# Patient Record
Sex: Female | Born: 1990 | Race: White | Hispanic: No | Marital: Single | Smoking: Current every day smoker
Health system: Southern US, Community
[De-identification: ages and names within clinical notes are randomized; demographics above are authoritative.]

## PROBLEM LIST (undated history)

## (undated) ENCOUNTER — Inpatient Hospital Stay (HOSPITAL_COMMUNITY): Payer: Self-pay

## (undated) DIAGNOSIS — Z789 Other specified health status: Secondary | ICD-10-CM

## (undated) HISTORY — PX: NO PAST SURGERIES: SHX2092

---
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
## 2010-12-07 ENCOUNTER — Emergency Department (HOSPITAL_BASED_OUTPATIENT_CLINIC_OR_DEPARTMENT_OTHER)
Admission: EM | Admit: 2010-12-07 | Discharge: 2010-12-07 | Payer: Self-pay | Source: Home / Self Care | Admitting: Emergency Medicine

## 2011-12-14 ENCOUNTER — Other Ambulatory Visit (HOSPITAL_COMMUNITY): Payer: Self-pay | Admitting: Obstetrics and Gynecology

## 2011-12-14 ENCOUNTER — Ambulatory Visit (HOSPITAL_COMMUNITY)
Admission: RE | Admit: 2011-12-14 | Discharge: 2011-12-14 | Disposition: A | Discharge status: Home / Self Care | Payer: Medicaid Other | Source: Ambulatory Visit | Attending: Obstetrics and Gynecology | Admitting: Obstetrics and Gynecology

## 2011-12-14 ENCOUNTER — Ambulatory Visit (HOSPITAL_COMMUNITY): Admission: RE | Admit: 2011-12-14 | Payer: Medicaid Other | Source: Ambulatory Visit

## 2011-12-14 DIAGNOSIS — Z1389 Encounter for screening for other disorder: Secondary | ICD-10-CM | POA: Insufficient documentation

## 2011-12-14 DIAGNOSIS — O2690 Pregnancy related conditions, unspecified, unspecified trimester: Secondary | ICD-10-CM

## 2011-12-14 DIAGNOSIS — O99330 Smoking (tobacco) complicating pregnancy, unspecified trimester: Secondary | ICD-10-CM | POA: Insufficient documentation

## 2011-12-14 DIAGNOSIS — Z363 Encounter for antenatal screening for malformations: Secondary | ICD-10-CM | POA: Insufficient documentation

## 2011-12-14 DIAGNOSIS — O358XX0 Maternal care for other (suspected) fetal abnormality and damage, not applicable or unspecified: Secondary | ICD-10-CM | POA: Insufficient documentation

## 2012-01-06 ENCOUNTER — Ambulatory Visit (HOSPITAL_COMMUNITY)
Admission: RE | Admit: 2012-01-06 | Discharge: 2012-01-06 | Disposition: A | Discharge status: Home / Self Care | Payer: Medicaid Other | Source: Ambulatory Visit | Attending: Obstetrics and Gynecology | Admitting: Obstetrics and Gynecology

## 2012-01-06 ENCOUNTER — Other Ambulatory Visit (HOSPITAL_COMMUNITY): Payer: Self-pay | Admitting: Obstetrics and Gynecology

## 2012-01-06 ENCOUNTER — Encounter (HOSPITAL_COMMUNITY): Payer: Self-pay

## 2012-01-06 VITALS — BP 120/70 | HR 93 | Wt 115.0 lb

## 2012-01-06 DIAGNOSIS — O99340 Other mental disorders complicating pregnancy, unspecified trimester: Secondary | ICD-10-CM

## 2012-01-06 DIAGNOSIS — O358XX0 Maternal care for other (suspected) fetal abnormality and damage, not applicable or unspecified: Secondary | ICD-10-CM

## 2012-01-06 DIAGNOSIS — O99330 Smoking (tobacco) complicating pregnancy, unspecified trimester: Secondary | ICD-10-CM | POA: Insufficient documentation

## 2012-01-06 NOTE — Progress Notes (Signed)
Amanda Whitehead was seen for ultrasound appointment today.  Please see [facility] report for details.

## 2012-01-24 ENCOUNTER — Ambulatory Visit (HOSPITAL_COMMUNITY)
Admission: RE | Admit: 2012-01-24 | Discharge: 2012-01-24 | Disposition: A | Discharge status: Home / Self Care | Payer: Medicaid Other | Source: Ambulatory Visit | Attending: Obstetrics and Gynecology | Admitting: Obstetrics and Gynecology

## 2012-01-24 DIAGNOSIS — O358XX0 Maternal care for other (suspected) fetal abnormality and damage, not applicable or unspecified: Secondary | ICD-10-CM

## 2012-01-24 DIAGNOSIS — O99330 Smoking (tobacco) complicating pregnancy, unspecified trimester: Secondary | ICD-10-CM | POA: Insufficient documentation

## 2012-02-15 ENCOUNTER — Inpatient Hospital Stay (HOSPITAL_COMMUNITY)
Admission: AD | Admit: 2012-02-15 | Discharge: 2012-02-15 | Disposition: A | Discharge status: Home / Self Care | Payer: Medicaid Other | Source: Ambulatory Visit | Attending: Obstetrics and Gynecology | Admitting: Obstetrics and Gynecology

## 2012-02-15 ENCOUNTER — Ambulatory Visit (HOSPITAL_COMMUNITY): Payer: Medicaid Other

## 2012-02-15 DIAGNOSIS — Z298 Encounter for other specified prophylactic measures: Secondary | ICD-10-CM | POA: Insufficient documentation

## 2012-02-15 DIAGNOSIS — Z2989 Encounter for other specified prophylactic measures: Secondary | ICD-10-CM | POA: Insufficient documentation

## 2012-02-15 DIAGNOSIS — Z3480 Encounter for supervision of other normal pregnancy, unspecified trimester: Secondary | ICD-10-CM | POA: Insufficient documentation

## 2012-02-15 MED ORDER — RHO D IMMUNE GLOBULIN 1500 UNIT/2ML IJ SOLN
300.0000 ug | Freq: Once | INTRAMUSCULAR | Status: AC
  Administered 2012-02-15: 300 ug via INTRAMUSCULAR

## 2012-02-15 NOTE — Plan of Care (Signed)
Rhophylac information sheet given to the patient in the lobby. Explained the 1 1/2 hours required to process this order and give the injection. Patient verbalizes understanding.

## 2012-02-16 ENCOUNTER — Ambulatory Visit (HOSPITAL_COMMUNITY): Payer: Medicaid Other

## 2012-02-16 LAB — RH IG WORKUP (INCLUDES ABO/RH): Antibody Screen: NEGATIVE

## 2012-02-24 ENCOUNTER — Other Ambulatory Visit: Payer: Medicaid Other

## 2012-02-24 ENCOUNTER — Encounter (INDEPENDENT_AMBULATORY_CARE_PROVIDER_SITE_OTHER): Payer: Medicaid Other | Admitting: Obstetrics and Gynecology

## 2012-02-24 DIAGNOSIS — Z331 Pregnant state, incidental: Secondary | ICD-10-CM

## 2012-03-03 ENCOUNTER — Inpatient Hospital Stay (HOSPITAL_COMMUNITY): Payer: Medicaid Other

## 2012-03-03 ENCOUNTER — Inpatient Hospital Stay (HOSPITAL_COMMUNITY)
Admission: AD | Admit: 2012-03-03 | Discharge: 2012-03-06 | DRG: 765 | Disposition: A | Discharge status: Home / Self Care | Payer: Medicaid Other | Attending: Obstetrics and Gynecology | Admitting: Obstetrics and Gynecology

## 2012-03-03 ENCOUNTER — Encounter (HOSPITAL_COMMUNITY): Payer: Self-pay | Admitting: Anesthesiology

## 2012-03-03 ENCOUNTER — Encounter (HOSPITAL_COMMUNITY): Payer: Self-pay | Admitting: *Deleted

## 2012-03-03 ENCOUNTER — Encounter (HOSPITAL_COMMUNITY)
Admission: AD | Disposition: A | Discharge status: Home / Self Care | Payer: Self-pay | Source: Home / Self Care | Attending: Obstetrics and Gynecology

## 2012-03-03 ENCOUNTER — Inpatient Hospital Stay (HOSPITAL_COMMUNITY): Payer: Medicaid Other | Admitting: Anesthesiology

## 2012-03-03 DIAGNOSIS — Z98891 History of uterine scar from previous surgery: Secondary | ICD-10-CM

## 2012-03-03 DIAGNOSIS — O358XX0 Maternal care for other (suspected) fetal abnormality and damage, not applicable or unspecified: Principal | ICD-10-CM | POA: Diagnosis present

## 2012-03-03 DIAGNOSIS — O0930 Supervision of pregnancy with insufficient antenatal care, unspecified trimester: Secondary | ICD-10-CM

## 2012-03-03 DIAGNOSIS — IMO0002 Reserved for concepts with insufficient information to code with codable children: Secondary | ICD-10-CM

## 2012-03-03 DIAGNOSIS — O9903 Anemia complicating the puerperium: Secondary | ICD-10-CM | POA: Diagnosis not present

## 2012-03-03 DIAGNOSIS — D649 Anemia, unspecified: Secondary | ICD-10-CM | POA: Diagnosis not present

## 2012-03-03 HISTORY — DX: Other specified health status: Z78.9

## 2012-03-03 LAB — DIFFERENTIAL
Basophils Absolute: 0 10*3/uL (ref 0.0–0.1)
Eosinophils Absolute: 0 10*3/uL (ref 0.0–0.7)
Eosinophils Relative: 0 % (ref 0–5)

## 2012-03-03 LAB — CBC
MCH: 31.8 pg (ref 26.0–34.0)
MCV: 94.7 fL (ref 78.0–100.0)
Platelets: 208 10*3/uL (ref 150–400)
RDW: 12.7 % (ref 11.5–15.5)
WBC: 19.5 10*3/uL — ABNORMAL HIGH (ref 4.0–10.5)

## 2012-03-03 LAB — URINE MICROSCOPIC-ADD ON

## 2012-03-03 LAB — URINALYSIS, ROUTINE W REFLEX MICROSCOPIC
Bilirubin Urine: NEGATIVE
Specific Gravity, Urine: 1.02 (ref 1.005–1.030)
Urobilinogen, UA: 0.2 mg/dL (ref 0.0–1.0)

## 2012-03-03 LAB — WET PREP, GENITAL
Clue Cells Wet Prep HPF POC: NONE SEEN
Trich, Wet Prep: NONE SEEN

## 2012-03-03 LAB — RPR: RPR Ser Ql: NONREACTIVE

## 2012-03-03 SURGERY — Surgical Case
Anesthesia: Spinal

## 2012-03-03 MED ORDER — SIMETHICONE 80 MG PO CHEW
80.0000 mg | CHEWABLE_TABLET | Freq: Three times a day (TID) | ORAL | Status: DC
  Administered 2012-03-03 – 2012-03-06 (×11): 80 mg via ORAL

## 2012-03-03 MED ORDER — PENICILLIN G POTASSIUM 5000000 UNITS IJ SOLR: 2.5000 10*6.[IU] | INTRAVENOUS | Status: DC

## 2012-03-03 MED ORDER — SCOPOLAMINE 1 MG/3DAYS TD PT72
MEDICATED_PATCH | TRANSDERMAL | Status: AC
  Administered 2012-03-03: 1.5 mg via TRANSDERMAL
  Filled 2012-03-03: qty 1

## 2012-03-03 MED ORDER — NALBUPHINE HCL 10 MG/ML IJ SOLN
5.0000 mg | INTRAMUSCULAR | Status: DC | PRN
  Filled 2012-03-03: qty 1

## 2012-03-03 MED ORDER — IBUPROFEN 600 MG PO TABS
600.0000 mg | ORAL_TABLET | Freq: Four times a day (QID) | ORAL | Status: DC | PRN
  Filled 2012-03-03 (×10): qty 1

## 2012-03-03 MED ORDER — MAGNESIUM SULFATE 40 MG/ML IJ SOLN: 2.0000 g | Freq: Once | INTRAMUSCULAR | Status: DC

## 2012-03-03 MED ORDER — DIBUCAINE 1 % RE OINT
1.0000 "application " | TOPICAL_OINTMENT | RECTAL | Status: DC | PRN
  Filled 2012-03-03: qty 28

## 2012-03-03 MED ORDER — METHYLERGONOVINE MALEATE 0.2 MG PO TABS
0.2000 mg | ORAL_TABLET | ORAL | Status: DC | PRN
Start: 2012-03-03 — End: 2012-03-06

## 2012-03-03 MED ORDER — NALOXONE HCL 0.4 MG/ML IJ SOLN
1.0000 ug/kg/h | INTRAMUSCULAR | Status: DC | PRN
  Filled 2012-03-03: qty 2.5

## 2012-03-03 MED ORDER — PENICILLIN G POTASSIUM 5000000 UNITS IJ SOLR
5.0000 10*6.[IU] | Freq: Once | INTRAVENOUS | Status: DC
  Filled 2012-03-03: qty 5

## 2012-03-03 MED ORDER — METHYLERGONOVINE MALEATE 0.2 MG/ML IJ SOLN: 0.2000 mg | INTRAMUSCULAR | Status: DC | PRN

## 2012-03-03 MED ORDER — TETANUS-DIPHTH-ACELL PERTUSSIS 5-2.5-18.5 LF-MCG/0.5 IM SUSP
0.5000 mL | Freq: Once | INTRAMUSCULAR | Status: AC
  Administered 2012-03-04: 0.5 mL via INTRAMUSCULAR
  Filled 2012-03-03: qty 0.5

## 2012-03-03 MED ORDER — FENTANYL CITRATE 0.05 MG/ML IJ SOLN: 25.0000 ug | INTRAMUSCULAR | Status: DC | PRN

## 2012-03-03 MED ORDER — IBUPROFEN 600 MG PO TABS
600.0000 mg | ORAL_TABLET | Freq: Four times a day (QID) | ORAL | Status: DC
  Administered 2012-03-03 – 2012-03-06 (×11): 600 mg via ORAL
  Filled 2012-03-03: qty 1

## 2012-03-03 MED ORDER — SIMETHICONE 80 MG PO CHEW: 80.0000 mg | CHEWABLE_TABLET | ORAL | Status: DC | PRN

## 2012-03-03 MED ORDER — SENNOSIDES-DOCUSATE SODIUM 8.6-50 MG PO TABS
2.0000 | ORAL_TABLET | Freq: Every day | ORAL | Status: DC
  Administered 2012-03-03 – 2012-03-05 (×3): 2 via ORAL

## 2012-03-03 MED ORDER — MEPERIDINE HCL 25 MG/ML IJ SOLN
6.2500 mg | INTRAMUSCULAR | Status: DC | PRN
  Administered 2012-03-03: 6.25 mg via INTRAVENOUS

## 2012-03-03 MED ORDER — ZOLPIDEM TARTRATE 10 MG PO TABS: 10.0000 mg | ORAL_TABLET | Freq: Every evening | ORAL | Status: DC | PRN

## 2012-03-03 MED ORDER — KETOROLAC TROMETHAMINE 30 MG/ML IJ SOLN: 30.0000 mg | Freq: Four times a day (QID) | INTRAMUSCULAR | Status: AC | PRN

## 2012-03-03 MED ORDER — CEFAZOLIN SODIUM 1-5 GM-% IV SOLN
1.0000 g | Freq: Once | INTRAVENOUS | Status: AC
  Administered 2012-03-03: 1 g via INTRAVENOUS

## 2012-03-03 MED ORDER — ONDANSETRON HCL 4 MG/2ML IJ SOLN: 4.0000 mg | Freq: Three times a day (TID) | INTRAMUSCULAR | Status: DC | PRN

## 2012-03-03 MED ORDER — MAGNESIUM SULFATE 40 MG/ML IJ SOLN
4.0000 g | Freq: Once | INTRAMUSCULAR | Status: DC
Start: 2012-03-03 — End: 2012-03-03
  Filled 2012-03-03: qty 100

## 2012-03-03 MED ORDER — ONDANSETRON HCL 4 MG PO TABS: 4.0000 mg | ORAL_TABLET | ORAL | Status: DC | PRN

## 2012-03-03 MED ORDER — OXYTOCIN 20 UNITS IN LACTATED RINGERS INFUSION - SIMPLE: 125.0000 mL/h | INTRAVENOUS | Status: AC

## 2012-03-03 MED ORDER — NALOXONE HCL 0.4 MG/ML IJ SOLN: 0.4000 mg | INTRAMUSCULAR | Status: DC | PRN

## 2012-03-03 MED ORDER — MEPERIDINE HCL 25 MG/ML IJ SOLN
INTRAMUSCULAR | Status: DC | PRN
  Administered 2012-03-03: 25 mg via INTRAVENOUS

## 2012-03-03 MED ORDER — BUPIVACAINE HCL (PF) 0.25 % IJ SOLN
INTRAMUSCULAR | Status: DC | PRN
  Administered 2012-03-03: 10 mL

## 2012-03-03 MED ORDER — MORPHINE SULFATE (PF) 0.5 MG/ML IJ SOLN
INTRAMUSCULAR | Status: DC | PRN
  Administered 2012-03-03: .15 mg via INTRATHECAL

## 2012-03-03 MED ORDER — ACETAMINOPHEN 325 MG PO TABS: 650.0000 mg | ORAL_TABLET | ORAL | Status: DC | PRN

## 2012-03-03 MED ORDER — WITCH HAZEL-GLYCERIN EX PADS: 1.0000 "application " | MEDICATED_PAD | CUTANEOUS | Status: DC | PRN

## 2012-03-03 MED ORDER — METOCLOPRAMIDE HCL 5 MG/ML IJ SOLN: 10.0000 mg | Freq: Three times a day (TID) | INTRAMUSCULAR | Status: DC | PRN

## 2012-03-03 MED ORDER — CALCIUM CARBONATE ANTACID 500 MG PO CHEW
2.0000 | CHEWABLE_TABLET | ORAL | Status: DC | PRN
  Filled 2012-03-03: qty 2

## 2012-03-03 MED ORDER — OXYCODONE-ACETAMINOPHEN 5-325 MG PO TABS
1.0000 | ORAL_TABLET | ORAL | Status: DC | PRN
  Administered 2012-03-03 – 2012-03-05 (×8): 1 via ORAL
  Filled 2012-03-03 (×8): qty 1

## 2012-03-03 MED ORDER — ZOLPIDEM TARTRATE 5 MG PO TABS: 5.0000 mg | ORAL_TABLET | Freq: Every evening | ORAL | Status: DC | PRN

## 2012-03-03 MED ORDER — DIPHENHYDRAMINE HCL 50 MG/ML IJ SOLN: 25.0000 mg | INTRAMUSCULAR | Status: DC | PRN

## 2012-03-03 MED ORDER — LACTATED RINGERS IV SOLN
INTRAVENOUS | Status: DC
  Administered 2012-03-03 (×2): via INTRAVENOUS

## 2012-03-03 MED ORDER — PRENATAL MULTIVITAMIN CH: 1.0000 | ORAL_TABLET | Freq: Every day | ORAL | Status: DC

## 2012-03-03 MED ORDER — MAGNESIUM SULFATE 40 G IN LACTATED RINGERS - SIMPLE
2.0000 g/h | INTRAVENOUS | Status: DC
  Administered 2012-03-03: 2 g via INTRAVENOUS
  Administered 2012-03-03: 2 g/h via INTRAVENOUS
  Filled 2012-03-03: qty 500

## 2012-03-03 MED ORDER — KETOROLAC TROMETHAMINE 30 MG/ML IJ SOLN
INTRAMUSCULAR | Status: DC | PRN
  Administered 2012-03-03: 30 mg via INTRAVENOUS

## 2012-03-03 MED ORDER — MENTHOL 3 MG MT LOZG: 1.0000 | LOZENGE | OROMUCOSAL | Status: DC | PRN

## 2012-03-03 MED ORDER — LANOLIN HYDROUS EX OINT: 1.0000 "application " | TOPICAL_OINTMENT | CUTANEOUS | Status: DC | PRN

## 2012-03-03 MED ORDER — ONDANSETRON HCL 4 MG/2ML IJ SOLN: 4.0000 mg | INTRAMUSCULAR | Status: DC | PRN

## 2012-03-03 MED ORDER — OXYTOCIN 20 UNITS IN LACTATED RINGERS INFUSION - SIMPLE
INTRAVENOUS | Status: DC | PRN
  Administered 2012-03-03 (×2): 20 [IU] via INTRAVENOUS

## 2012-03-03 MED ORDER — FENTANYL CITRATE 0.05 MG/ML IJ SOLN
INTRAMUSCULAR | Status: DC | PRN
  Administered 2012-03-03: 25 ug via INTRATHECAL
  Administered 2012-03-03: 25 ug via INTRAVENOUS
  Administered 2012-03-03: 50 ug via INTRAVENOUS

## 2012-03-03 MED ORDER — PRENATAL MULTIVITAMIN CH
1.0000 | ORAL_TABLET | Freq: Every day | ORAL | Status: DC
  Administered 2012-03-03 – 2012-03-06 (×4): 1 via ORAL
  Filled 2012-03-03 (×4): qty 1

## 2012-03-03 MED ORDER — DIPHENHYDRAMINE HCL 25 MG PO CAPS: 25.0000 mg | ORAL_CAPSULE | ORAL | Status: DC | PRN

## 2012-03-03 MED ORDER — ONDANSETRON HCL 4 MG/2ML IJ SOLN
INTRAMUSCULAR | Status: DC | PRN
  Administered 2012-03-03: 4 mg via INTRAVENOUS

## 2012-03-03 MED ORDER — DOCUSATE SODIUM 100 MG PO CAPS
100.0000 mg | ORAL_CAPSULE | Freq: Every day | ORAL | Status: DC
  Filled 2012-03-03: qty 1

## 2012-03-03 MED ORDER — PRENATAL MULTIVITAMIN CH
1.0000 | ORAL_TABLET | Freq: Every day | ORAL | Status: DC
Start: 2012-03-03 — End: 2012-03-03
  Filled 2012-03-03: qty 1

## 2012-03-03 MED ORDER — DIPHENHYDRAMINE HCL 25 MG PO CAPS: 25.0000 mg | ORAL_CAPSULE | Freq: Four times a day (QID) | ORAL | Status: DC | PRN

## 2012-03-03 MED ORDER — BUTORPHANOL TARTRATE 2 MG/ML IJ SOLN
1.0000 mg | INTRAMUSCULAR | Status: DC
  Administered 2012-03-03: 1 mg via INTRAVENOUS
  Filled 2012-03-03: qty 1

## 2012-03-03 MED ORDER — FERROUS SULFATE 325 (65 FE) MG PO TABS
325.0000 mg | ORAL_TABLET | Freq: Two times a day (BID) | ORAL | Status: DC
  Administered 2012-03-03 – 2012-03-06 (×6): 325 mg via ORAL
  Filled 2012-03-03 (×6): qty 1

## 2012-03-03 MED ORDER — DIPHENHYDRAMINE HCL 50 MG/ML IJ SOLN: 12.5000 mg | INTRAMUSCULAR | Status: DC | PRN

## 2012-03-03 MED ORDER — SODIUM CHLORIDE 0.9 % IJ SOLN: 3.0000 mL | INTRAMUSCULAR | Status: DC | PRN

## 2012-03-03 MED ORDER — PHENYLEPHRINE HCL 10 MG/ML IJ SOLN
INTRAMUSCULAR | Status: DC | PRN
  Administered 2012-03-03 (×2): 80 ug via INTRAVENOUS

## 2012-03-03 MED ORDER — KETOROLAC TROMETHAMINE 60 MG/2ML IM SOLN
60.0000 mg | Freq: Once | INTRAMUSCULAR | Status: AC | PRN
  Filled 2012-03-03: qty 2

## 2012-03-03 MED ORDER — SCOPOLAMINE 1 MG/3DAYS TD PT72
1.0000 | MEDICATED_PATCH | Freq: Once | TRANSDERMAL | Status: DC
  Administered 2012-03-03: 1.5 mg via TRANSDERMAL
  Filled 2012-03-03: qty 1

## 2012-03-03 MED ORDER — MEPERIDINE HCL 25 MG/ML IJ SOLN
INTRAMUSCULAR | Status: AC
  Administered 2012-03-03: 6.25 mg via INTRAVENOUS
  Filled 2012-03-03: qty 1

## 2012-03-03 MED ORDER — MEASLES, MUMPS & RUBELLA VAC ~~LOC~~ INJ
0.5000 mL | INJECTION | Freq: Once | SUBCUTANEOUS | Status: DC
  Filled 2012-03-03: qty 0.5

## 2012-03-03 SURGICAL SUPPLY — 36 items
APL SKNCLS STERI-STRIP NONHPOA (GAUZE/BANDAGES/DRESSINGS) ×1
BENZOIN TINCTURE PRP APPL 2/3 (GAUZE/BANDAGES/DRESSINGS) ×2 IMPLANT
BOOTIES KNEE HIGH SLOAN (MISCELLANEOUS) ×4 IMPLANT
CHLORAPREP W/TINT 26ML (MISCELLANEOUS) ×2 IMPLANT
CLOTH BEACON ORANGE TIMEOUT ST (SAFETY) ×2 IMPLANT
DRAIN JACKSON PRT FLT 10 (DRAIN) IMPLANT
DRESSING TELFA 8X3 (GAUZE/BANDAGES/DRESSINGS) ×2 IMPLANT
ELECT REM PT RETURN 9FT ADLT (ELECTROSURGICAL) ×2
ELECTRODE REM PT RTRN 9FT ADLT (ELECTROSURGICAL) ×1 IMPLANT
EVACUATOR SILICONE 100CC (DRAIN) IMPLANT
EXTRACTOR VACUUM M CUP 4 TUBE (SUCTIONS) IMPLANT
GAUZE SPONGE 4X4 12PLY STRL LF (GAUZE/BANDAGES/DRESSINGS) ×3 IMPLANT
GLOVE BIOGEL PI IND STRL 7.0 (GLOVE) ×2 IMPLANT
GLOVE BIOGEL PI INDICATOR 7.0 (GLOVE) ×2
GLOVE ECLIPSE 6.5 STRL STRAW (GLOVE) ×2 IMPLANT
GOWN PREVENTION PLUS LG XLONG (DISPOSABLE) ×6 IMPLANT
KIT ABG SYR 3ML LUER SLIP (SYRINGE) IMPLANT
NDL HYPO 25X5/8 SAFETYGLIDE (NEEDLE) IMPLANT
NEEDLE HYPO 22GX1.5 SAFETY (NEEDLE) ×2 IMPLANT
NEEDLE HYPO 25X5/8 SAFETYGLIDE (NEEDLE) IMPLANT
NS IRRIG 1000ML POUR BTL (IV SOLUTION) ×4 IMPLANT
PACK C SECTION WH (CUSTOM PROCEDURE TRAY) ×2 IMPLANT
PAD ABD 7.5X8 STRL (GAUZE/BANDAGES/DRESSINGS) ×2 IMPLANT
RTRCTR C-SECT PINK 25CM LRG (MISCELLANEOUS) ×1 IMPLANT
SLEEVE SCD COMPRESS KNEE MED (MISCELLANEOUS) IMPLANT
STRIP CLOSURE SKIN 1/2X4 (GAUZE/BANDAGES/DRESSINGS) ×2 IMPLANT
SUT CHROMIC GUT AB #0 18 (SUTURE) IMPLANT
SUT MNCRL AB 3-0 PS2 27 (SUTURE) ×2 IMPLANT
SUT SILK 2 0 FSL 18 (SUTURE) IMPLANT
SUT VIC AB 0 CTX 36 (SUTURE) ×4
SUT VIC AB 0 CTX36XBRD ANBCTRL (SUTURE) ×2 IMPLANT
SUT VIC AB 1 CT1 36 (SUTURE) ×4 IMPLANT
SYR 20CC LL (SYRINGE) ×2 IMPLANT
TOWEL OR 17X24 6PK STRL BLUE (TOWEL DISPOSABLE) ×4 IMPLANT
TRAY FOLEY CATH 14FR (SET/KITS/TRAYS/PACK) ×2 IMPLANT
WATER STERILE IRR 1000ML POUR (IV SOLUTION) ×2 IMPLANT

## 2012-03-03 NOTE — Anesthesia Preprocedure Evaluation (Signed)

## 2012-03-03 NOTE — Anesthesia Procedure Notes (Signed)

## 2012-03-03 NOTE — Progress Notes (Signed)
UR Chart review completed.  

## 2012-03-03 NOTE — Op Note (Signed)
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 Cristela Blue  Procedure: Primary low transverse cesarean section  Surgeon: Dr. Dois Davenport Cordai Rodrigue  Assistant: Lavera Guise CNM  Estimated blood loss: 600 cc  Procedure:  After being informed of the planned procedure and possible complications including bleeding, infection, injury to other organs, informed consent is obtained. The patient is taken to OR #9 and given spinal anesthesia without complication. She is placed in the dorsal decubitus position with the pelvis tilted to the left. She is then prepped and draped in a sterile fashion. A Foley catheter is inserted in her bladder.  After assessing adequate level of anesthesia, we infiltrate the suprapubic area with 20 cc of Marcaine 0.25 and perform a Pfannenstiel incision which is brought down sharply to the fascia. The fascia is entered in a low transverse fashion. Linea alba is dissected. Peritoneum is entered in a midline fashion. An Alexis retractor is easily positioned. Visceral peritoneum is entered in a low transverse fashion allowing [place] to safely retract bladder by developing a bladder flap.  The myometrium is then entered in a low transverse fashion; first with knife and then extended bluntly. Amniotic fluid is clear and very abundant. We assist the birth of a female  infant in vertex presentation with arms flexed and legs flexed. We also not bilateral clubfeet.. Mouth and nose are suctioned. The baby is delivered. The cord is clamped and sectioned. The baby is given to the neonatologist present in the room.  10 cc of blood is drawn from the umbilical vein.2 cc of blood is drawn from the umbilical artery for blood gas.The placenta is allowed to deliver spontaneously. It is complete and the cord has 3 vessels. Uterine revision is negative.  We proceed with closure of the  myometrium in 2 layers: First with a running locked suture of 0 Vicryl, then with a Lembert suture of 0 Vicryl imbricating the first one. Hemostasis is completed with cauterization on peritoneal edges.  Both paracolic gutters are cleaned. Both tubes and ovaries are assessed and normal. The pelvis is profusely irrigated with warm saline to confirm a satisfactory hemostasis.  Retractors and sponges are removed. Under fascia hemostasis is completed with cauterization. The fascia is then closed with 2 running sutures of 0 Vicryl meeting midline. The wound is irrigated with warm saline and hemostasis is completed with cauterization. The skin is closed with a subcuticular suture of 3-0 Monocryl and Steri-Strips.  Instrument and sponge count is complete x2. Estimated blood loss is 600 cc.  The procedure is well tolerated by the patient who is taken to recovery room in a well and stable condition.  female baby was born at 10:56. Apgars are pending.    Specimen: Placenta sent to [facility]   Jaelyne Deeg A MD 3/8/201312:08 PM

## 2012-03-03 NOTE — Brief Op Note (Signed)
03/03/2012  10:59 AM  PATIENT:  Amanda Whitehead  21 y.o. 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       [facility] AMD 03/03/2012 10:59 AM

## 2012-03-03 NOTE — H&P (Signed)
Amanda Whitehead is a 21 y.o. female presenting for c/o of constipation had a bm since getting here, backache, denies srom, vag bleeding, with +FM, will need C/S states. Pg significant for fetal arthogryposis History OB History    Grav Para Term Preterm Abortions TAB SAB Ect Mult Living   1 0 0 0 0 0 0 0 0 0     med hx neg Meds: PNV  Past Surgical History  Procedure Date  . No past surgeries    Family History: father- HTN, DM, Mother-Thyroid disorder Social History:  reports that she has never smoked. She does not have any smokeless tobacco history on file. She reports that she does not drink alcohol or use illicit drugs.  ROS  Dilation: 3 Effacement (%): 90 Station: -2 Exam by:: Estrella Myrtle  CNM Blood pressure 98/62, pulse 117, temperature 97.6 F (36.4 C), temperature source Oral, resp. rate 22, height 5\' 2"  (1.575 m), weight 57.607 kg (127 lb), last menstrual period 07/06/2011. Exam Physical Exam tense with contractionis, lungs clear bilaterally, ap RRR, abd soft, gravid, nt, bowel sounds active, no edema lower legs, SSE , vag 3 100 -2 bulging membranes. Fhts category 1 uc q 2-3 mod Prenatal labs: ABO, Rh: --/--/O NEG (02/19 1620) Antibody: NEG (02/19 1620) Rubella:  Immnune RPR:   NR HBsAg:   neg HIV:   neg GBS:   pending  Assessment/Plan: 31 3/7 week IUP PTL Pending FFN not sent, sent GBS, wet prep, ua, unable to do GC/CHL pt to uncomfortable, discussed per telephone with Dr. Estanislado Pandy, plan Magnesium, Pen G, Betamethasone, patient agrees with plan of care.  Addendum; Dr. Estanislado Pandy here now vag 5 for C/S consented by MD.  [facility], [facility] 03/03/2012, 9:24 AM

## 2012-03-03 NOTE — Anesthesia Postprocedure Evaluation (Signed)
Anesthesia Post Note  Patient: Amanda Whitehead  Procedure(s) Performed: Procedure(s) (LRB): CESAREAN SECTION (N/A)  Anesthesia type: Spinal  Patient location: PACU  Post pain: Pain level controlled  Post assessment: Post-op Vital signs reviewed  Last Vitals:  Filed Vitals:   03/03/12 1100  BP: 114/70  Pulse: 94  Temp:   Resp: 16    Post vital signs: Reviewed  Level of consciousness: awake  Complications: No apparent anesthesia complications

## 2012-03-03 NOTE — Progress Notes (Signed)
I was referred to pt by Child psychotherapist.  I spoke with Amanda Whitehead and the baby's father, Amanda Whitehead.   They were joyful at baby's birth and did not seem to want to talk about any concerns that they had.  They told me that the doctors were putting in a central line, but that they had her stable.  They asked for prayer and we prayed for Amanda Whitehead.  I spoke with the neonatologist about my concerns that they weren't really aware of the critical condition of the baby that the social worker had informed me of.  He said that they were not able to hear it or take it in right now.  He said he will continue to talk with them about the baby's situation and will continue to make sure that both MOB and FOB can see Amanda Whitehead as much as possible.  Please page as needed, [phone].  Chaplain Katy Analee Montee 3:08 PM

## 2012-03-03 NOTE — Progress Notes (Signed)
Patient states she has been having cramping and constipation  since Wednesday. Has not had a bowel movement since last weekend.

## 2012-03-03 NOTE — Preoperative (Signed)
Beta Blockers   Reason not to administer Beta Blockers:Not Applicable 

## 2012-03-03 NOTE — Transfer of Care (Signed)
Immediate Anesthesia Transfer of Care Note  Patient: Amanda Whitehead  Procedure(s) Performed: Procedure(s) (LRB): CESAREAN SECTION (N/A)  Patient Location: PACU  Anesthesia Type: Spinal  Level of Consciousness: awake, alert  and oriented  Airway & Oxygen Therapy: Patient Spontanous Breathing  Post-op Assessment: Report given to PACU RN  Post vital signs: Reviewed  Complications: No apparent anesthesia complications

## 2012-03-04 ENCOUNTER — Encounter (HOSPITAL_COMMUNITY): Payer: Self-pay | Admitting: *Deleted

## 2012-03-04 LAB — CBC
HCT: 28.9 % — ABNORMAL LOW (ref 36.0–46.0)
Hemoglobin: 9.6 g/dL — ABNORMAL LOW (ref 12.0–15.0)
MCV: 96.3 fL (ref 78.0–100.0)
Platelets: 184 10*3/uL (ref 150–400)
RBC: 3 MIL/uL — ABNORMAL LOW (ref 3.87–5.11)
WBC: 15.4 10*3/uL — ABNORMAL HIGH (ref 4.0–10.5)

## 2012-03-04 MED ORDER — INFLUENZA VIRUS VACC SPLIT PF IM SUSP
0.5000 mL | INTRAMUSCULAR | Status: AC
Start: 2012-03-05 — End: 2012-03-06
  Filled 2012-03-04: qty 0.5

## 2012-03-04 NOTE — Addendum Note (Signed)
Addendum  created 03/04/12 0818 by Len Blalock, CRNA   Modules edited:Notes Section

## 2012-03-04 NOTE — Anesthesia Postprocedure Evaluation (Signed)
  Anesthesia Post-op Note  Patient: Amanda Whitehead  Procedure(s) Performed: Procedure(s) (LRB): CESAREAN SECTION (N/A)  Patient Location: PACU and Women's Unit  Anesthesia Type: Spinal  Level of Consciousness: awake, alert  and oriented  Airway and Oxygen Therapy: Patient Spontanous Breathing   Post-op Assessment: Patient's Cardiovascular Status Stable and Respiratory Function Stable  Post-op Vital Signs: stable  Complications: No apparent anesthesia complications

## 2012-03-04 NOTE — Progress Notes (Signed)
Subjective: Postpartum Day 1: Cesarean Delivery Patient reports that pain is well-managed.Lochia normal.  Ambulating, voiding, tolerating diet as ordered without difficulty. Normal flatus. Absent bowel movement. Baby is doing better in the NICU  Objective: Vital signs in last 24 hours: Temp:  [98.2 F (36.8 C)-99 F (37.2 C)] 98.3 F (36.8 C) (03/09 1000) Pulse Rate:  [80-107] 90  (03/09 1000) Resp:  [16-20] 18  (03/09 1000) BP: (99-119)/(61-80) 108/69 mmHg (03/09 1000) SpO2:  [93 %-100 %] 98 % (03/09 1000)  Physical Exam:  General: alert Lochia: appropriate Uterine Fundus: firm and appropriately tender Incision: dressing clean DVT Evaluation: No evidence of DVT seen on physical exam. Edema 1+   Basename 03/04/12 0511 03/03/12 0820  HGB 9.6* 10.7*  HCT 28.9* 31.9*    Assessment/Plan: Status post Cesarean section. Doing well postoperatively.  Continue current care. Anticipate discharge 03/06/12  Joseph Johns A 03/04/2012, 10:11 AM

## 2012-03-04 NOTE — Progress Notes (Signed)
PSYCHOSOCIAL ASSESSMENT ~ MATERNAL/CHILD Name:  Amanda Whitehead Age 21 day Referral Date 03/04/12 Reason/Source  NICU admission  I. FAMILY/HOME ENVIRONMENT A. Child's Legal Guardian  Parent(s)  X Amanda Whitehead parent    DSS  Name Amanda Whitehead DOB  [birth year] Age  21 y/o Address [address] [place], [place]  [address] Name  Amanda Whitehead (FOB) DOB Age  Address Same as above C.   Other Support   II. PSYCHOSOCIAL DATA A. Information Source  Patient Interview X  Family Interview           Other B. Event organiser  Employment  N/A  Medicaid  Yes   [employer]                                  Self Pay   Food Stamps  Will apply    [employer]  Will apply   Work Scientist, physiological Housing      Section 8     Maternity Care Coordination/Child Service Coordination/Early Intervention    School  Grade      Other Cultural and Environment Information Cultural Issues Impacting Care  III. STRENGTHS  Supportive family/friends  Yes   Adequate Resources  Yes Compliance with medical plan Yes Home prepared for Child (including basic supplies)  Yes Understanding of illness Questionable (See note below)          Other  IV. RISK FACTORS AND CURRENT PROBLEMS V. No Problems Noted VI. Substance Abuse                                           Pt Family             Mental Illness     Pt Family               Family/Relationship Issues   Pt Family      Abuse/Neglect/Domestic Violence   Pt Family   Financial Resources     Pt Family  Transportation     Pt Family  DSS Involvement    Pt Family  Adjustment to Illness    Pt Family   Knowledge/Cognitive Deficit   Pt Family   Compliance with Treatment   Pt Family   Basic Needs (food, housing, etc)  Pt Family  Housing Concerns    Pt Family  Other             VII. SOCIAL WORK ASSESSMENT SW met with MOB and FOB due to NICU admission.  After consulting unit RN, Amanda Whitehead, regarding status of baby, SW met  with parents.  When SW inquired about their understanding of baby's illness, MOB reported statements that were made prior to baby's birth and not related to current condition.  She did not expound on this information, however.  MOB said after baby is released they were going to have her admitted to [facility] to address her contracture issues.  After further discussion, it appears that MOB does not identify the gravity of baby's condition.  FOB was on the cell phone during most of visit and did not participate in discussion.  MOB stated she has sufficient supplies and support.  Provided MOB with NICU/SW  Brochure.  SW encouraged MOB to contact SW with further questions or needs.  SW to follow up tomorrow to assess MOB coping and needs.  VIII. SOCIAL WORK PLAN (In [place]) No Further Intervention Required/No Barriers to Discharge Psychosocial Support and Ongoing Assessment of Needs Patient/Family  Education Child Protective Services Report  [place]        Date Information/Referral to [facility]   Other

## 2012-03-05 LAB — RH IG WORKUP (INCLUDES ABO/RH): Fetal Screen: NEGATIVE

## 2012-03-05 LAB — CULTURE, BETA STREP (GROUP B ONLY)

## 2012-03-05 NOTE — Progress Notes (Signed)
Subjective: comfortable some cramping, passing gas, no N,V pumping breasts, baby doing better with good movement exception R wrist. Postpartum Day 2 Cesarean Delivery  Patient reports no problems voiding.    Objective: Vital signs in last 24 hours: Temp:  [98 F (36.7 C)-98.9 F (37.2 C)] 98 F (36.7 C) (03/10 1300) Pulse Rate:  [78-87] 81  (03/10 1300) Resp:  [18-20] 20  (03/10 1300) BP: (102-116)/(63-72) 107/70 mmHg (03/10 1300) SpO2:  [97 %-100 %] 100 % (03/10 1300)  Physical Exam:  General: alert, cooperative and no distress FF sm serosa flow, lungs clear bilaterally, AP RRR, bowel sounds active, abd, distended. Lochia: appropriate Uterine Fundus: firm Incision: healing well, no dehiscence, no redness, edema, or drainage DVT Evaluation: Negative Homan's sign.No edema   Basename 03/04/12 0511 03/03/12 0820  HGB 9.6* 10.7*  HCT 28.9* 31.9*    Assessment/Plan: Status post Cesarean section. Doing well postoperatively.  Continue current care.  Alva Kuenzel 03/05/2012, 2:29 PM

## 2012-03-05 NOTE — Progress Notes (Signed)
Subjective: Postpartum Day day 1 Cesarean Delivery Patient reports tolerating PO, + flatus, + BM and no problems voiding.    Objective: Vital signs in last 24 hours: Temp:  [98 F (36.7 C)-98.9 F (37.2 C)] 98.9 F (37.2 C) (03/10 0545) Pulse Rate:  [78-87] 83  (03/10 0545) Resp:  [18-20] 18  (03/10 0545) BP: (102-116)/(63-72) 106/63 mmHg (03/10 0545) SpO2:  [97 %-100 %] 97 % (03/10 0545)  Physical Exam:  General: alert, cooperative and no distress lungs clear bilaterally, AP RRR, bowel sounds active, abd softly distended, tympanic Lochia: appropriate Uterine Fundus: firm Incision: healing well, no significant drainage, no dehiscence, no significant erythema, with steri stips in place DVT Evaluation: Negative Homan's sign. No significant calf/ankle edema.   Basename 03/04/12 0511 03/03/12 0820  HGB 9.6* 10.7*  HCT 28.9* 31.9*    Assessment/Plan: Status post Cesarean section. Doing well postoperatively. anemia  Lactating Normal involution Continue current care.  Amanda Whitehead 03/05/2012, 12:45 PM

## 2012-03-06 ENCOUNTER — Other Ambulatory Visit: Payer: Medicaid Other

## 2012-03-06 ENCOUNTER — Encounter: Payer: Medicaid Other | Admitting: Obstetrics and Gynecology

## 2012-03-06 ENCOUNTER — Encounter (HOSPITAL_COMMUNITY): Payer: Self-pay | Admitting: Obstetrics and Gynecology

## 2012-03-06 DIAGNOSIS — Z98891 History of uterine scar from previous surgery: Secondary | ICD-10-CM

## 2012-03-06 MED ORDER — OXYCODONE-ACETAMINOPHEN 5-325 MG PO TABS: 1.0000 | ORAL_TABLET | ORAL | Status: AC | PRN

## 2012-03-06 MED ORDER — IBUPROFEN 600 MG PO TABS: 600.0000 mg | ORAL_TABLET | Freq: Four times a day (QID) | ORAL | Status: AC | PRN

## 2012-03-06 MED ORDER — FERROUS SULFATE 325 (65 FE) MG PO TABS: 325.0000 mg | ORAL_TABLET | Freq: Two times a day (BID) | ORAL | Status: AC

## 2012-03-06 NOTE — Progress Notes (Signed)
03/06/12 1335  Clinical Encounter Type  Visited With Patient and family together (Pt, FOB Bobby)  Visit Type Follow-up;Spiritual support;Social support  Referral From Chaplain  Recommendations Continue to provide emotional support through NICU stay.  Spiritual Encounters  Spiritual Needs Emotional  Stress Factors  Family Stress Factors Other (Comment) (Baby's health/genetic/congenital concerns, parents' adjustme)    Referred by Kathleen Argue for follow-up support.  Met pt and FOB Bobby in NICU, just as they were joyfully (and nervously) getting to hold baby Lauris Poag for the first time.  Provided pastoral presence, offered ongoing spiritual care, served as witness to their story, celebrated this tender milestone with family, took family photos with Bobby's phone per request.  Spiritual Care will continue to follow for spiritual and emotional support through NICU stay, as baby's diagnoses/conditions/needs become more clear and parents move through adjustment process.  Avis Epley, [place] Chaplain [phone]

## 2012-03-06 NOTE — Progress Notes (Signed)
SW met with parents in MOB's third floor room to introduce myself and evaluate how they are coping with baby's medical situation today.  Both parents were engaged in the conversation, although MOB did most of the talking.  They were very pleasant and state they are just taking things one day at a time.  MOB states she is most concerned about baby's ability to breathe on her own.  MOB states that she thinks she will be discharging today and although she is sad about having to leave baby here, she is looking forward to resting in her own bed.  Parents live 4 minutes from the hospital and know they can visit any time.  MOB told SW that NICU staff are running more tests to continue to come up with further diagnosis.  Parents appear to be hopeful, and have a fairly good understanding of the situation at this point.  SW informed parents of baby's possible eligibility for SSI.  They would like to discuss it and will get back to SW if they are interested in applying.  SW emailed the [employer] with protective filing information.

## 2012-03-06 NOTE — Discharge Instructions (Signed)
Anemia, Frequently Asked Questions WHAT ARE THE SYMPTOMS OF ANEMIA?  Headache.   Difficulty thinking.   Fatigue.   Shortness of breath.   Weakness.   Rapid heartbeat.  AT WHAT POINT ARE PEOPLE CONSIDERED ANEMIC?  This varies with gender and age.   Both hemoglobin (Hgb) and hematocrit values are used to define anemia. These lab values are obtained from a complete blood count (CBC) test. This is performed at a caregiver's office.   The normal range of hemoglobin values for adult men is 14.0 g/dL to 17.4 g/dL. For nonpregnant women, values are 12.3 g/dL to 15.3 g/dL.   The World Health Organization defines anemia as less than 12 g/dL for nonpregnant women and less than 13 g/dL for men.   For adult males, the average normal hematocrit is 46%, and the range is 40% to 52%.   For adult females, the average normal hematocrit is 41%, and the range is 35% to 47%.   Values that fall below the lower limits can be a sign of anemia and should have further checking (evaluation).  GROUPS OF PEOPLE WHO ARE AT RISK FOR DEVELOPING ANEMIA INCLUDE:   Infants who are breastfed or taking a formula that is not fortified with iron.   Children going through a rapid growth spurt. The iron available can not keep up with the needs for a red cell mass which must grow with the child.   Women in childbearing years. They need iron because of blood loss during menstruation.   Pregnant women. The growing fetus creates a high demand for iron.   People with ongoing gastrointestinal blood loss are at risk of developing iron deficiency.   Individuals with leukemia or cancer who must receive chemotherapy or radiation to treat their disease. The drugs or radiation used to treat these diseases often decreases the bone marrow's ability to make cells of all classes. This includes red blood cells, white blood cells, and platelets.   Individuals with chronic inflammatory conditions such as rheumatoid arthritis or  chronic infections.   The elderly.  ARE SOME TYPES OF ANEMIA INHERITED?   Yes, some types of anemia are due to inherited or genetic defects.   Sickle cell anemia. This occurs most often in people of African, African American, and Mediterranean descent.   Thalassemia (or Cooley's anemia). This type is found in people of Mediterranean and Southeast Asian descent. These types of anemia are common.   [place]. This is rare.  CAN CERTAIN MEDICATIONS CAUSE A PERSON TO BECOME ANEMIC?  Yes. For example, drugs to fight cancer (chemotherapeutic agents) often cause anemia. These drugs can slow the bone marrow's ability to make red blood cells. If there are not enough red blood cells, the body does not get enough oxygen. WHAT HEMATOCRIT LEVEL IS REQUIRED TO DONATE BLOOD?  The lower limit of an acceptable hematocrit for blood donors is 38%. If you have a low hematocrit value, you should schedule an appointment with your caregiver. ARE BLOOD TRANSFUSIONS COMMONLY USED TO CORRECT ANEMIA, AND ARE THEY DANGEROUS?  They are used to treat anemia as a last resort. Your caregiver will find the cause of the anemia and correct it if possible. Most blood transfusions are given because of excessive bleeding at the time of surgery, with trauma, or because of bone marrow suppression in patients with cancer or leukemia on chemotherapy. Blood transfusions are safer than ever before. We also know that blood transfusions affect the immune system and may increase certain risks. There is   also a concern for human error. In 1/16,000 transfusions, a patient receives a transfusion of blood that is not matched with his or her blood type.  WHAT IS IRON DEFICIENCY ANEMIA AND CAN I CORRECT IT BY CHANGING MY DIET?  Iron is an essential part of hemoglobin. Without enough hemoglobin, anemia develops and the body does not get the right amount of oxygen. Iron deficiency anemia develops after the body has had a low level of iron for a long  time. This is either caused by blood loss, not taking in or absorbing enough iron, or increased demands for iron (like pregnancy or rapid growth).  Foods from animal origin such as beef, chicken, and pork, are good sources of iron. Be sure to have one of these foods at each meal. Vitamin C helps your body absorb iron. Foods rich in Vitamin C include citrus, bell pepper, strawberries, spinach and cantaloupe. In some cases, iron supplements may be needed in order to correct the iron deficiency. In the case of poor absorption, extra iron may have to be given directly into the vein through a needle (intravenously). I HAVE BEEN DIAGNOSED WITH IRON DEFICIENCY ANEMIA AND MY CAREGIVER PRESCRIBED IRON SUPPLEMENTS. HOW LONG WILL IT TAKE FOR MY BLOOD TO BECOME NORMAL?  It depends on the degree of anemia at the beginning of treatment. Most people with mild to moderate iron deficiency, anemia will correct the anemia over a period of 2 to 3 months. But after the anemia is corrected, the iron stored by the body is still low. Caregivers often suggest an additional 6 months of oral iron therapy once the anemia has been reversed. This will help prevent the iron deficiency anemia from quickly happening again. Non-anemic adult males should take iron supplements only under the direction of a doctor, too much iron can cause liver damage.  MY HEMOGLOBIN IS 9 G/DL AND I AM SCHEDULED FOR SURGERY. SHOULD I POSTPONE THE SURGERY?  If you have Hgb of 9, you should discuss this with your caregiver right away. Many patients with similar hemoglobin levels have had surgery without problems. If minimal blood loss is expected for a minor procedure, no treatment may be necessary.  If a greater blood loss is expected for more extensive procedures, you should ask your caregiver about being treated with erythropoietin and iron. This is to accelerate the recovery of your hemoglobin to a normal level before surgery. An anemic patient who undergoes  high-blood-loss surgery has a greater risk of surgical complications and need for a blood transfusion, which also carries some risk.  I HAVE BEEN TOLD THAT HEAVY MENSTRUAL PERIODS CAUSE ANEMIA. IS THERE ANYTHING I CAN DO TO PREVENT THE ANEMIA?  Anemia that results from heavy periods is usually due to iron deficiency. You can try to meet the increased demands for iron caused by the heavy monthly blood loss by increasing the intake of iron-rich foods. Iron supplements may be required. Discuss your concerns with your caregiver. WHAT CAUSES ANEMIA DURING PREGNANCY?  Pregnancy places major demands on the body. The mother must meet the needs of both her body and her growing baby. The body needs enough iron and folate to make the right amount of red blood cells. To prevent anemia while pregnant, the mother should stay in close contact with her caregiver.  Be sure to eat a diet that has foods rich in iron and folate like liver and dark green leafy vegetables. Folate plays an important role in the normal development of a baby's   spinal cord. Folate can help prevent serious disorders like spina bifida. If your diet does not provide adequate nutrients, you may want to talk with your caregiver about nutritional supplements.  WHAT IS THE RELATIONSHIP BETWEEN FIBROID TUMORS AND ANEMIA IN WOMEN?  The relationship is usually caused by the increased menstrual blood loss caused by fibroids. Good iron intake may be required to prevent iron deficiency anemia from developing.  Document Released: 07/21/2004 Document Revised: 12/02/2011 Document Reviewed: 01/05/2011 [facility] Patient Information 2012 [place], [place]. Cesarean Section Procedure Note  Breastfeeding BENEFITS OF BREASTFEEDING For the baby  The first milk (colostrum) helps the baby's digestive system function better.   There are antibodies from the mother in the milk that help the baby fight off infections.   The baby has a lower incidence of asthma,  allergies, and SIDS (sudden infant death syndrome).   The nutrients in breast milk are better than formulas for the baby and helps the baby's brain grow better.   Babies who breastfeed have less gas, colic, and constipation.  For the mother  Breastfeeding helps develop a very special bond between mother and baby.   It is more convenient, always available at the correct temperature and cheaper than formula feeding.   It burns calories in the mother and helps with losing weight that was gained during pregnancy.   It makes the uterus contract back down to normal size faster and slows bleeding following delivery.   Breastfeeding mothers have a lower risk of developing breast cancer.  NURSE FREQUENTLY  A healthy, full-term baby may breastfeed as often as every hour or space his or her feedings to every 3 hours.   How often to nurse will vary from baby to baby. Watch your baby for signs of hunger, not the clock.   Nurse as often as the baby requests, or when you feel the need to reduce the fullness of your breasts.   Awaken the baby if it has been 3 to 4 hours since the last feeding.   Frequent feeding will help the mother make more milk and will prevent problems like sore nipples and engorgement of the breasts.  BABY'S POSITION AT THE BREAST  Whether lying down or sitting, be sure that the baby's tummy is facing your tummy.   Support the breast with 4 fingers underneath the breast and the thumb above. Make sure your fingers are well away from the nipple and baby's mouth.   Stroke the baby's lips and cheek closest to the breast gently with your finger or nipple.   When the baby's mouth is open wide enough, place all of your nipple and as much of the dark area around the nipple as possible into your baby's mouth.   Pull the baby in close so the tip of the nose and the baby's cheeks touch the breast during the feeding.  FEEDINGS  The length of each feeding varies from baby to baby and  from feeding to feeding.   The baby must suck about 2 to 3 minutes for your milk to get to him or her. This is called a "let down." For this reason, allow the baby to feed on each breast as long as he or she wants. Your baby will end the feeding when he or she has received the right balance of nutrients.   To break the suction, put your finger into the corner of the baby's mouth and slide it between his or her gums before removing your breast from his  or her mouth. This will help prevent sore nipples.  REDUCING BREAST ENGORGEMENT  In the first week after your baby is born, you may experience signs of breast engorgement. When breasts are engorged, they feel heavy, warm, full, and may be tender to the touch. You can reduce engorgement if you:   Nurse frequently, every 2 to 3 hours. Mothers who breastfeed early and often have fewer problems with engorgement.   Place light ice packs on your breasts between feedings. This reduces swelling. Wrap the ice packs in a lightweight towel to protect your skin.   Apply moist hot packs to your breast for 5 to 10 minutes before each feeding. This increases circulation and helps the milk flow.   Gently massage your breast before and during the feeding.   Make sure that the baby empties at least one breast at every feeding before switching sides.   Use a breast pump to empty the breasts if your baby is sleepy or not nursing well. You may also want to pump if you are returning to work or or you feel you are getting engorged.   Avoid bottle feeds, pacifiers or supplemental feedings of water or juice in place of breastfeeding.   Be sure the baby is latched on and positioned properly while breastfeeding.   Prevent fatigue, stress, and anemia.   Wear a supportive bra, avoiding underwire styles.   Eat a balanced diet with enough fluids.  If you follow these suggestions, your engorgement should improve in 24 to 48 hours. If you are still experiencing  difficulty, call your lactation consultant or caregiver. IS MY BABY GETTING ENOUGH MILK? Sometimes, mothers worry about whether their babies are getting enough milk. You can be assured that your baby is getting enough milk if:  The baby is actively sucking and you hear swallowing.   The baby nurses at least 8 to 12 times in a 24 hour time period. Nurse your baby until he or she unlatches or falls asleep at the first breast (at least 10 to 20 minutes), then offer the second side.   The baby is wetting 5 to 6 disposable diapers (6 to 8 cloth diapers) in a 24 hour period by 52 to 53 days of age.   The baby is having at least 2 to 3 stools every 24 hours for the first few months. Breast milk is all the food your baby needs. It is not necessary for your baby to have water or formula. In fact, to help your breasts make more milk, it is best not to give your baby supplemental feedings during the early weeks.   The stool should be soft and yellow.   The baby should gain 4 to 7 ounces per week after he is 75 days old.  TAKE CARE OF YOURSELF Take care of your breasts by:  Bathing or showering daily.   Avoiding the use of soaps on your nipples.   Start feedings on your left breast at one feeding and on your right breast at the next feeding.   You will notice an increase in your milk supply 2 to 5 days after delivery. You may feel some discomfort from engorgement, which makes your breasts very firm and often tender. Engorgement "peaks" out within 24 to 48 hours. In the meantime, apply warm moist towels to your breasts for 5 to 10 minutes before feeding. Gentle massage and expression of some milk before feeding will soften your breasts, making it easier for your baby to  latch on. Wear a well fitting nursing bra and air dry your nipples for 10 to 15 minutes after each feeding.   Only use cotton bra pads.   Only use pure lanolin on your nipples after nursing. You do not need to wash it off before nursing.    Take care of yourself by:   Eating well-balanced meals and nutritious snacks.   Drinking milk, fruit juice, and water to satisfy your thirst (about 8 glasses a day).   Getting plenty of rest.   Increasing calcium in your diet (1200 mg a day).   Avoiding foods that you notice affect the baby in a bad way.  SEEK MEDICAL CARE IF:   You have any questions or difficulty with breastfeeding.   You need help.   You have a hard, red, sore area on your breast, accompanied by a fever of 100.5 F (38.1 C) or more.   Your baby is too sleepy to eat well or is having trouble sleeping.   Your baby is wetting less than 6 diapers per day, by 77 days of age.   Your baby's skin or white part of his or her eyes is more yellow than it was in the hospital.   You feel depressed.  Document Released: 12/13/2005 Document Revised: 12/02/2011 Document Reviewed: 07/28/2009 [facility] Patient Information 2012 [place], [place].

## 2012-03-06 NOTE — Progress Notes (Signed)
This was a follow-up from a visit I made with family on Friday.  Family seemed more withdrawn today.  The reality of leaving the hospital while her baby remained here is difficult for [facility].  They are taking hope in the fact that her breathing seems to be improving.  Overall, they still seem hopeful, but there hope seems somewhat tempered today.  I will continue to follow this family in the NICU.  Please page as needed.  [phone].  Chaplain Dyanne Carrel 10:43 AM   03/06/12 1000  Clinical Encounter Type  Visited With Patient and family together  Visit Type Follow-up  Spiritual Encounters  Spiritual Needs Emotional

## 2012-03-06 NOTE — Discharge Summary (Signed)
Obstetric Discharge Summary Reason for Admission: cesarean section for fetal indications, preterm labor 31 37 week Prenatal Procedures: ultrasound Intrapartum Procedures: cesarean: low cervical, transverse and  Postpartum Procedures: none Complications-Operative and Postpartum: none  Hospital course: presented with backache with PTL, for C/S for fetal indication with arthogryposis.  Hemoglobin  Date Value Range Status  03/04/2012 9.6* 12.0-15.0 (g/dL) Final     HCT  Date Value Range Status  03/04/2012 28.9* 36.0-46.0 (%) Final  S Comfortable, pain meds working well, had bm, plans depo provera with 6 week appt. PE lungs clear bilaterally, AP RRR, abd soft, nt, bowel sounds active, ff sm serosa flow, -Homan's sign bilaterally  Discharge Diagnoses: PTL 31 3/7 weeks, Cesarean section for fetal indications anemia  Discharge Information: Date: 03/06/2012 Activity: pelvic rest Diet: routine Medications: Ibuprofen, Iron and Percocet Condition: stable Instructions: refer to practice specific booklet Discharge to: home Follow-up Information    Follow up with [facility] in 6 weeks.         Newborn Data: Live born female  Birth Weight: 3 lb 3.5 oz (1460 g) APGAR: 5, 7  Home with baby in NICU.  Amanda Whitehead 03/06/2012, 3:23 PM

## 2012-03-21 NOTE — Discharge Summary (Signed)
Ms. Sharples presented to the [facility] [place] [facility] on March 03, 2012 at 31 weeks and 3 days gestation. She had been followed at the [facility] Gynecology division of [facility]. The patient was noted to be in preterm labor and she progressed rapidly to 5 cm dilatation. The patient's fetus was known to have arthogryposis. The patient did not receive betamethasone because of her active preterm labor and because of the need for an urgent cesarean section.  Mylinda Latina.D.

## 2012-05-01 ENCOUNTER — Ambulatory Visit (INDEPENDENT_AMBULATORY_CARE_PROVIDER_SITE_OTHER): Payer: Medicaid Other | Admitting: Obstetrics and Gynecology

## 2012-05-01 VITALS — BP 114/66 | Temp 98.3°F | Resp 16 | Ht 63.0 in | Wt 128.0 lb

## 2012-05-01 DIAGNOSIS — O0990 Supervision of high risk pregnancy, unspecified, unspecified trimester: Secondary | ICD-10-CM

## 2012-05-01 NOTE — Progress Notes (Addendum)
Patient ID: Amanda Whitehead, female   DOB: 1991-05-16, 20 y.o.   MRN: [id number] Date of delivery: 03/03/2012 Female Name: Domenic Polite Vaginal delivery:no Cesarean section:yes Tubal ligation:no GDM:no Breast Feeding:n/a Bottle Feeding: n/a Post-Partum Blues:Yes Abnormal pap:no Normal GU function: yes Normal GI function:yes Returning to work:yes; returned back to work March 29, 2012  Pt declines birth control today  EPDS 13  Subjective:     Amanda Whitehead is a 21 y.o. female who presents for a postpartum visit.  I have fully reviewed the prenatal and intrapartum course. See information above. The patient's baby had arthrogriposis and the baby died shortly after birth.  The patient and her husband continue to grieve.   Patient is not sexually active.   The following portions of the patient's history were reviewed and updated as appropriate: allergies, current medications, past family history, past medical history, past social history, past surgical history and problem list.  Review of Systems Pertinent items are noted in HPI.   Objective:    BP 114/66  Temp 98.3 F (36.8 C)  Resp 16  Ht 5\' 3"  (1.6 m)  Wt 128 lb (58.06 kg)  BMI 22.67 kg/m2  Breastfeeding? No  General:  alert, cooperative and no distress     Lungs: clear to auscultation bilaterally  Heart:  regular rate and rhythm, S1, S2 normal, no murmur  Abdomen: soft, non-tender; bowel sounds normal; no masses,  no organomegaly   Vulva:  normal  Vagina: normal vagina  Cervix:  normal  Corpus: normal size, contour, position, consistency, mobility, non-tender  Adnexa:  normal adnexa        Cesarean section incision well healed: yes       Assessment:     Normal postpartum exam. 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 The patient is still very sad, and continues to grieve. Pap smear not done at today's visit.   Plan:     1. Contraception: none. 2. EPDS: 13. 3. Follow up in: 5 month or as  needed. 4. Breast Feeding: No. 5. Return to work, exercising, and a normal activities: yes.  Postpartum depression discussed today.  The patient declined medication and counseling.  She was told that she could always return to see [place] if she does not improve on her own.  She declines contraception and says that she and her husband want to get pregnant again soon.     Janine Limbo MD 05/01/2012 6:41 PM

## 2013-10-14 ENCOUNTER — Emergency Department (HOSPITAL_COMMUNITY)
Admission: EM | Admit: 2013-10-14 | Discharge: 2013-10-14 | Disposition: A | Discharge status: Home / Self Care | Payer: Managed Care, Other (non HMO) | Attending: Emergency Medicine | Admitting: Emergency Medicine

## 2013-10-14 ENCOUNTER — Encounter (HOSPITAL_COMMUNITY): Payer: Self-pay | Admitting: Emergency Medicine

## 2013-10-14 DIAGNOSIS — N898 Other specified noninflammatory disorders of vagina: Secondary | ICD-10-CM | POA: Insufficient documentation

## 2013-10-14 DIAGNOSIS — Z113 Encounter for screening for infections with a predominantly sexual mode of transmission: Secondary | ICD-10-CM | POA: Insufficient documentation

## 2013-10-14 DIAGNOSIS — R000 Tachycardia, unspecified: Secondary | ICD-10-CM | POA: Insufficient documentation

## 2013-10-14 DIAGNOSIS — Z711 Person with feared health complaint in whom no diagnosis is made: Secondary | ICD-10-CM

## 2013-10-14 DIAGNOSIS — Z3202 Encounter for pregnancy test, result negative: Secondary | ICD-10-CM | POA: Insufficient documentation

## 2013-10-14 LAB — RPR: RPR Ser Ql: NONREACTIVE

## 2013-10-14 LAB — URINALYSIS, ROUTINE W REFLEX MICROSCOPIC
Bilirubin Urine: NEGATIVE
Hgb urine dipstick: NEGATIVE
Specific Gravity, Urine: 1.03 (ref 1.005–1.030)
Urobilinogen, UA: 1 mg/dL (ref 0.0–1.0)

## 2013-10-14 LAB — HIV ANTIBODY (ROUTINE TESTING W REFLEX): HIV: NONREACTIVE

## 2013-10-14 LAB — WET PREP, GENITAL: Yeast Wet Prep HPF POC: NONE SEEN

## 2013-10-14 NOTE — ED Notes (Signed)
Pt arrived to ED with a complaint of the possibility of exposure to an STD.  Pt's partner has had NGU and Chlamydia.  Pt has had unprotected sex with this partner and partner has told Pt he is having symptoms at this time.  Pt wants to be checked for STD

## 2013-10-14 NOTE — ED Notes (Signed)
Pt denies abdominal or pelvic pain. No dysuria or discharge. LMP 10/04/13.

## 2013-10-14 NOTE — ED Provider Notes (Signed)
CSN: [id number]     Arrival date & time 10/14/13  9147 History   First MD Initiated Contact with Patient 10/14/13 [phone]     Chief Complaint  Patient presents with  . Exposure to STD    HPI  Amanda Whitehead is a 22 y.o. female with a PMH of cesarean section who presents to the ED for evaluation of STD exposure.  History was provided by the patient.  Patient states that her sexual partner told her that he was having some burning with urination. He does not have a confirmed STD.  He has an appointment on Monday for STD screening. Patient states that she became worried about his symptoms and wanted checked. She states that she's had unprotected intercourse with this partner. She states that she has vaginal discharge, but this vaginal discharge that she "always has" with no acute changes. She denies any foul genital orders, vaginal bleeding, pelvic pain, abdominal pain, nausea, vomiting, diarrhea, or constipation. She is otherwise asymptomatic. No fevers, chills, change in appetite or activity, headache, dizziness, lightheadedness, leg edema, chest pain, shortness of breath, cough, rhinorrhea, or sore throat. Her last normal menstrual period was beginning of October. Patient denies any history of STI's in the past. G1P1   Past Medical History  Diagnosis Date  . No pertinent past medical history    Past Surgical History  Procedure Laterality Date  . No past surgeries    . Cesarean section  03/03/2012    Procedure: CESAREAN SECTION;  Surgeon: Esmeralda Arthur, MD;  Location: [facility];  Service: Gynecology;  Laterality: N/A;   No family history on file. History  Substance Use Topics  . Smoking status: Never Smoker   . Smokeless tobacco: Not on file  . Alcohol Use: No   OB History   Grav Para Term Preterm Abortions TAB SAB Ect Mult Living   1 1 0 1 0 0 0 0 0 1      Review of Systems  Constitutional: Negative for fever, chills, activity change, appetite change and fatigue.  HENT: Negative for  congestion and rhinorrhea.   Eyes: Negative for visual disturbance.  Respiratory: Negative for cough and shortness of breath.   Cardiovascular: Negative for chest pain and leg swelling.  Gastrointestinal: Negative for nausea, vomiting, abdominal pain, diarrhea and constipation.  Genitourinary: Positive for vaginal discharge. Negative for frequency, hematuria, flank pain, decreased urine volume, vaginal bleeding, difficulty urinating, genital sores, vaginal pain, pelvic pain and dyspareunia.  Musculoskeletal: Negative for back pain.  Skin: Negative for wound.  Neurological: Negative for dizziness, syncope, weakness, light-headedness, numbness and headaches.  Psychiatric/Behavioral: Negative for confusion.    Allergies  Review of patient's allergies indicates no known allergies.  Home Medications  No current outpatient prescriptions on file. BP 118/83  Pulse 114  Temp(Src) 97.9 F (36.6 C) (Oral)  Resp 18  Ht 5\' 3"  (1.6 m)  Wt 100 lb 3.2 oz (45.45 kg)  BMI 17.75 kg/m2  SpO2 99%  LMP 09/26/2013  Breastfeeding? No  Filed Vitals:   10/14/13 0550 10/14/13 0812 10/14/13 0914  BP: 118/83 118/64 111/59  Pulse: 114 103 89  Temp: 97.9 F (36.6 C)  98.2 F (36.8 C)  TempSrc: Oral    Resp: 18 18 16   Height: 5\' 3"  (1.6 m)    Weight: 100 lb 3.2 oz (45.45 kg)    SpO2: 99% 100% 99%    Physical Exam  Nursing note and vitals reviewed. Constitutional: She is oriented to person, place, and time.  She appears well-developed and well-nourished. No distress.  HENT:  Head: Normocephalic and atraumatic.  Right Ear: External ear normal.  Left Ear: External ear normal.  Mouth/Throat: Oropharynx is clear and moist. No oropharyngeal exudate.  Eyes: Conjunctivae are normal. Pupils are equal, round, and reactive to light. Right eye exhibits no discharge. Left eye exhibits no discharge.  Neck: Normal range of motion. Neck supple.  Cardiovascular: Regular rhythm, normal heart sounds and intact  distal pulses.  Exam reveals no gallop and no friction rub.   No murmur heard. Tachycardic  Pulmonary/Chest: Effort normal and breath sounds normal. No respiratory distress. She has no wheezes. She has no rales. She exhibits no tenderness.  Abdominal: Soft. Bowel sounds are normal. She exhibits no distension and no mass. There is no tenderness. There is no rebound and no guarding.  Musculoskeletal: Normal range of motion. She exhibits no edema and no tenderness.  No CVA tenderness bilaterally.    Neurological: She is alert and oriented to person, place, and time.  Skin: Skin is warm and dry. She is not diaphoretic.    ED Course  Procedures (including critical care time) Labs Review Labs Reviewed - No data to display Imaging Review No results found.  EKG Interpretation   None      Results for orders placed during the hospital encounter of 10/14/13  WET PREP, GENITAL      Result Value Range   Yeast Wet Prep HPF POC NONE SEEN  NONE SEEN   Trich, Wet Prep NONE SEEN  NONE SEEN   Clue Cells Wet Prep HPF POC NONE SEEN  NONE SEEN   WBC, Wet Prep HPF POC FEW (*) NONE SEEN  URINALYSIS, ROUTINE W REFLEX MICROSCOPIC      Result Value Range   Color, Urine YELLOW  YELLOW   APPearance CLOUDY (*) CLEAR   Specific Gravity, Urine 1.030  1.005 - 1.030   pH 6.0  5.0 - 8.0   Glucose, UA NEGATIVE  NEGATIVE mg/dL   Hgb urine dipstick NEGATIVE  NEGATIVE   Bilirubin Urine NEGATIVE  NEGATIVE   Ketones, ur NEGATIVE  NEGATIVE mg/dL   Protein, ur NEGATIVE  NEGATIVE mg/dL   Urobilinogen, UA 1.0  0.0 - 1.0 mg/dL   Nitrite NEGATIVE  NEGATIVE   Leukocytes, UA NEGATIVE  NEGATIVE  PREGNANCY, URINE      Result Value Range   Preg Test, Ur NEGATIVE  NEGATIVE    MDM   1. Concern about sexually transmitted disease in female without diagnosis     Amanda Whitehead is a 22 y.o. female with a PMH of cesarean section who presents to the ED for evaluation of STD exposure.  UA, urine pregnancy, HIV, RPR, wet  mount, CG probe sent.     Rechecks   7:45 AM = pelvic exam performed at bedside with ED tech Rachael. Wet mount and current Chlamydia testing sent. Minimal amount of thin white discharge present in the vaginal vault. No cervical motion or adnexal tenderness bilaterally. No vaginal bleeding.  9:00 AM = spoke with patient about results. Patient states that she has low suspicion for STI at this time. She states that her boyfriend only complained of an irritation with no other symptoms. She is asymptomatic. She states she is okay with not being treated at this time. She states that she will wait for call regarding results.   Patient seen in the emergency department for STD check. Patient is asymptomatic.  She describes a possible exposure with her  partner who complained of some burning with urination, however, does not have a confirmed infection. Her abdominal exam was benign, she was afebrile, patient nontoxic in appearance, and her pelvic exam exam reveals no suspicion for PID.  Spoke with patient about prophylactic treatment.  It was in agreement that we would not treat at this time and wait for results of Gonorrhea and Chlamydia testing and HIV/RPR.  Patient was instructed to return to the ED if they experience any symptoms including fever, severe abdominal pain, repeated vomiting or other concerns.  Patient was encouraged to followup at the [facility] health clinic for further evaluation and management.  Patient was in agreement with discharge and plan.      Final impressions: 1. Concern for STD      [facility] PA-C         Jillyn Ledger, PA-C 10/14/13 [phone]

## 2013-10-15 LAB — GC/CHLAMYDIA PROBE AMP: CT Probe RNA: NEGATIVE

## 2013-10-15 NOTE — ED Provider Notes (Signed)
Medical screening examination/treatment/procedure(s) were performed by non-physician practitioner and as supervising physician I was immediately available for consultation/collaboration.   Suzi Roots, MD 10/15/13 [phone]

## 2014-10-28 ENCOUNTER — Encounter (HOSPITAL_COMMUNITY): Payer: Self-pay | Admitting: Emergency Medicine

## 2014-12-05 ENCOUNTER — Emergency Department (HOSPITAL_BASED_OUTPATIENT_CLINIC_OR_DEPARTMENT_OTHER)
Admission: EM | Admit: 2014-12-05 | Discharge: 2014-12-05 | Disposition: A | Discharge status: Home / Self Care | Payer: Managed Care, Other (non HMO) | Attending: Emergency Medicine | Admitting: Emergency Medicine

## 2014-12-05 ENCOUNTER — Encounter (HOSPITAL_BASED_OUTPATIENT_CLINIC_OR_DEPARTMENT_OTHER): Payer: Self-pay | Admitting: *Deleted

## 2014-12-05 ENCOUNTER — Emergency Department (HOSPITAL_BASED_OUTPATIENT_CLINIC_OR_DEPARTMENT_OTHER): Payer: Managed Care, Other (non HMO)

## 2014-12-05 DIAGNOSIS — Z720 Tobacco use: Secondary | ICD-10-CM | POA: Insufficient documentation

## 2014-12-05 DIAGNOSIS — Y9389 Activity, other specified: Secondary | ICD-10-CM | POA: Insufficient documentation

## 2014-12-05 DIAGNOSIS — S29091A Other injury of muscle and tendon of front wall of thorax, initial encounter: Secondary | ICD-10-CM | POA: Insufficient documentation

## 2014-12-05 DIAGNOSIS — Y92410 Unspecified street and highway as the place of occurrence of the external cause: Secondary | ICD-10-CM | POA: Insufficient documentation

## 2014-12-05 DIAGNOSIS — Y998 Other external cause status: Secondary | ICD-10-CM | POA: Diagnosis not present

## 2014-12-05 DIAGNOSIS — R079 Chest pain, unspecified: Secondary | ICD-10-CM

## 2014-12-05 DIAGNOSIS — R0789 Other chest pain: Secondary | ICD-10-CM

## 2014-12-05 DIAGNOSIS — R071 Chest pain on breathing: Secondary | ICD-10-CM

## 2014-12-05 MED ORDER — HYDROCODONE-ACETAMINOPHEN 5-325 MG PO TABS: 1.0000 | ORAL_TABLET | Freq: Four times a day (QID) | ORAL | Status: AC | PRN
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
## 2014-12-05 NOTE — ED Notes (Signed)
Pt d/c home- directed to pharmacy to pick up meds
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
## 2014-12-05 NOTE — Discharge Instructions (Signed)
Take motrin for pain.   Take vicodin for severe pain. Do NOT drive with it.   Follow up with your doctor.   Return to ER if you have severe pain, trouble breathing, fever, vomiting, headaches.

## 2015-06-20 IMAGING — CR DG CHEST 2V
2 series · 2 of 2 positions shown · non-contrast
Comparison: None.

CLINICAL DATA: Chest pain.

EXAM:
CHEST  2 VIEW

[w chest pa]
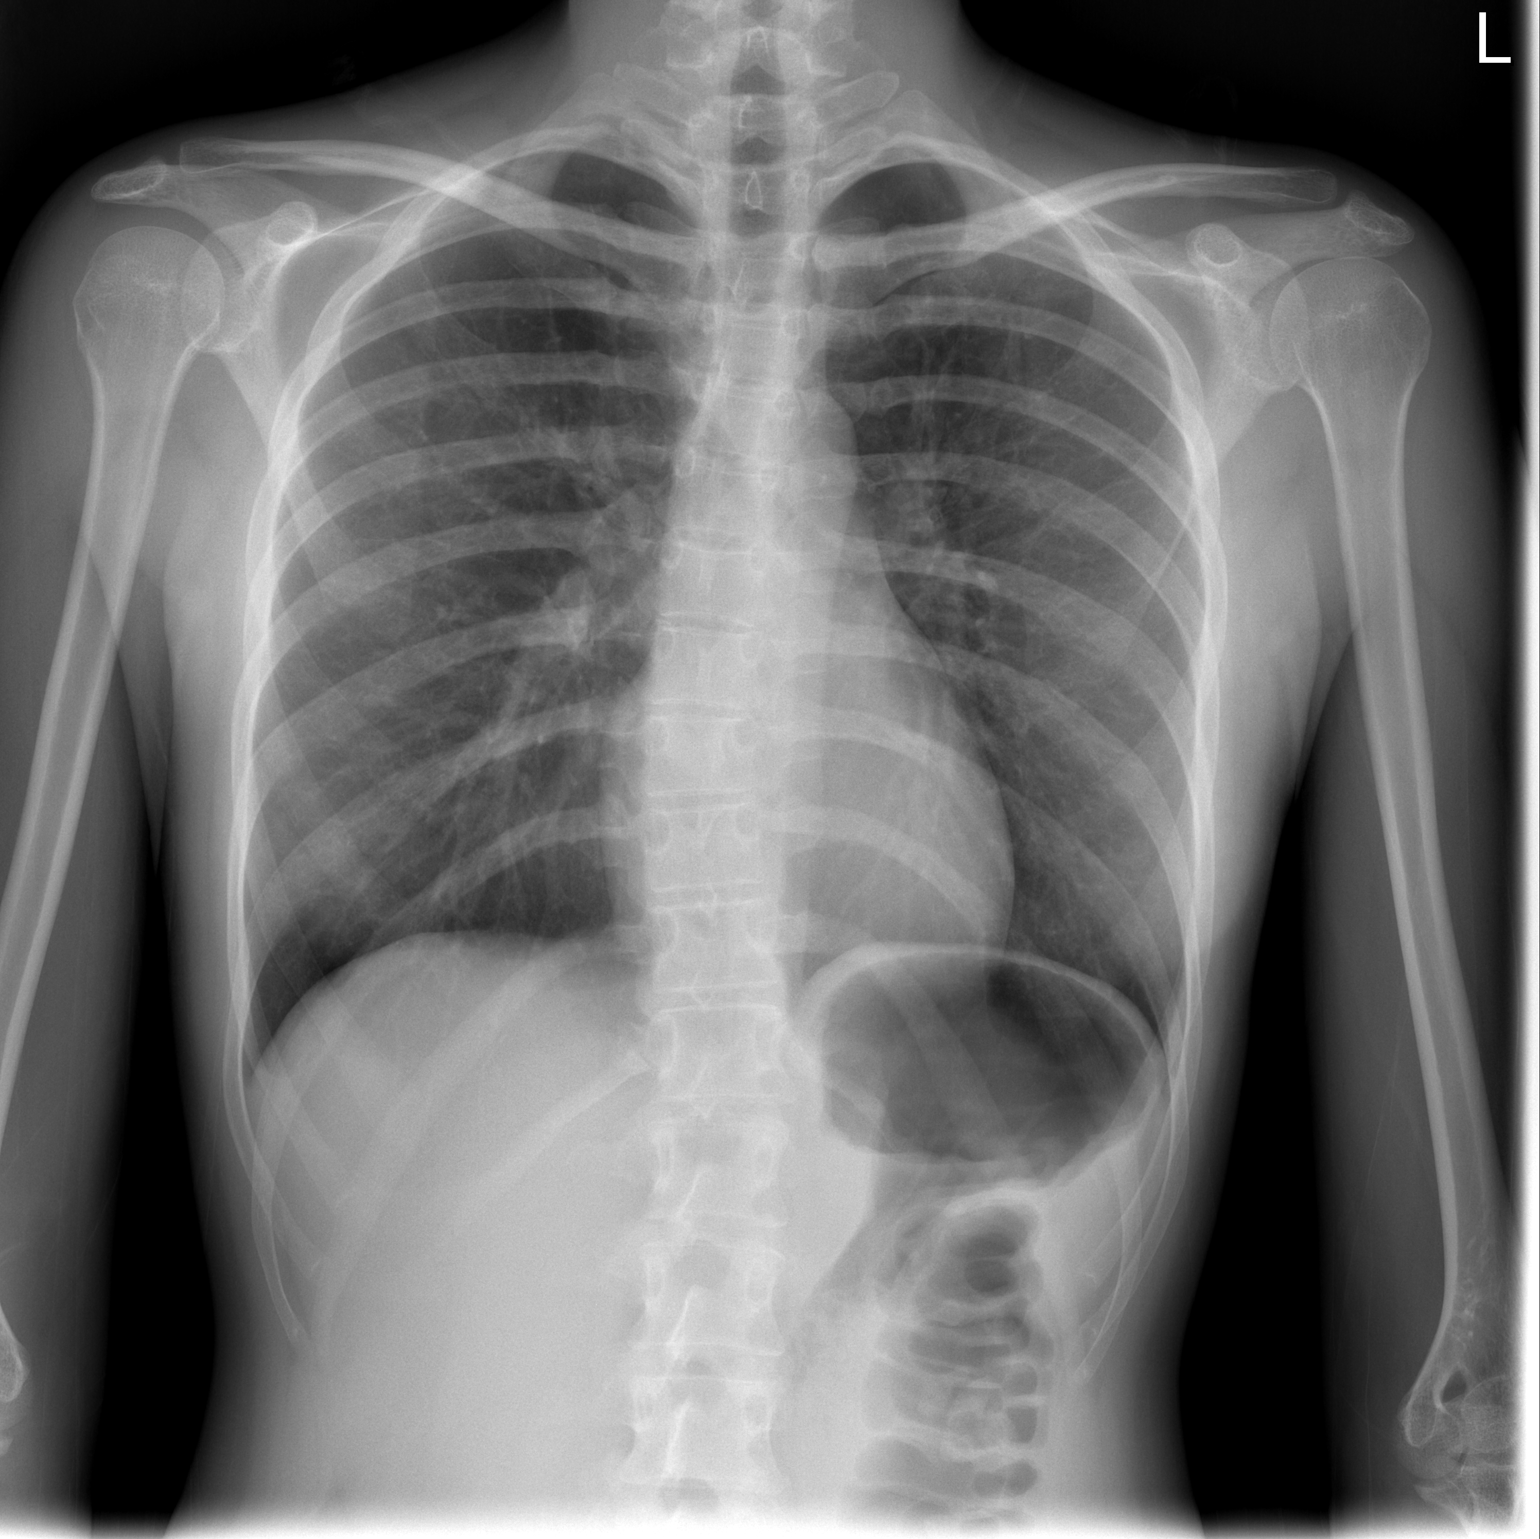

[w chest lat]
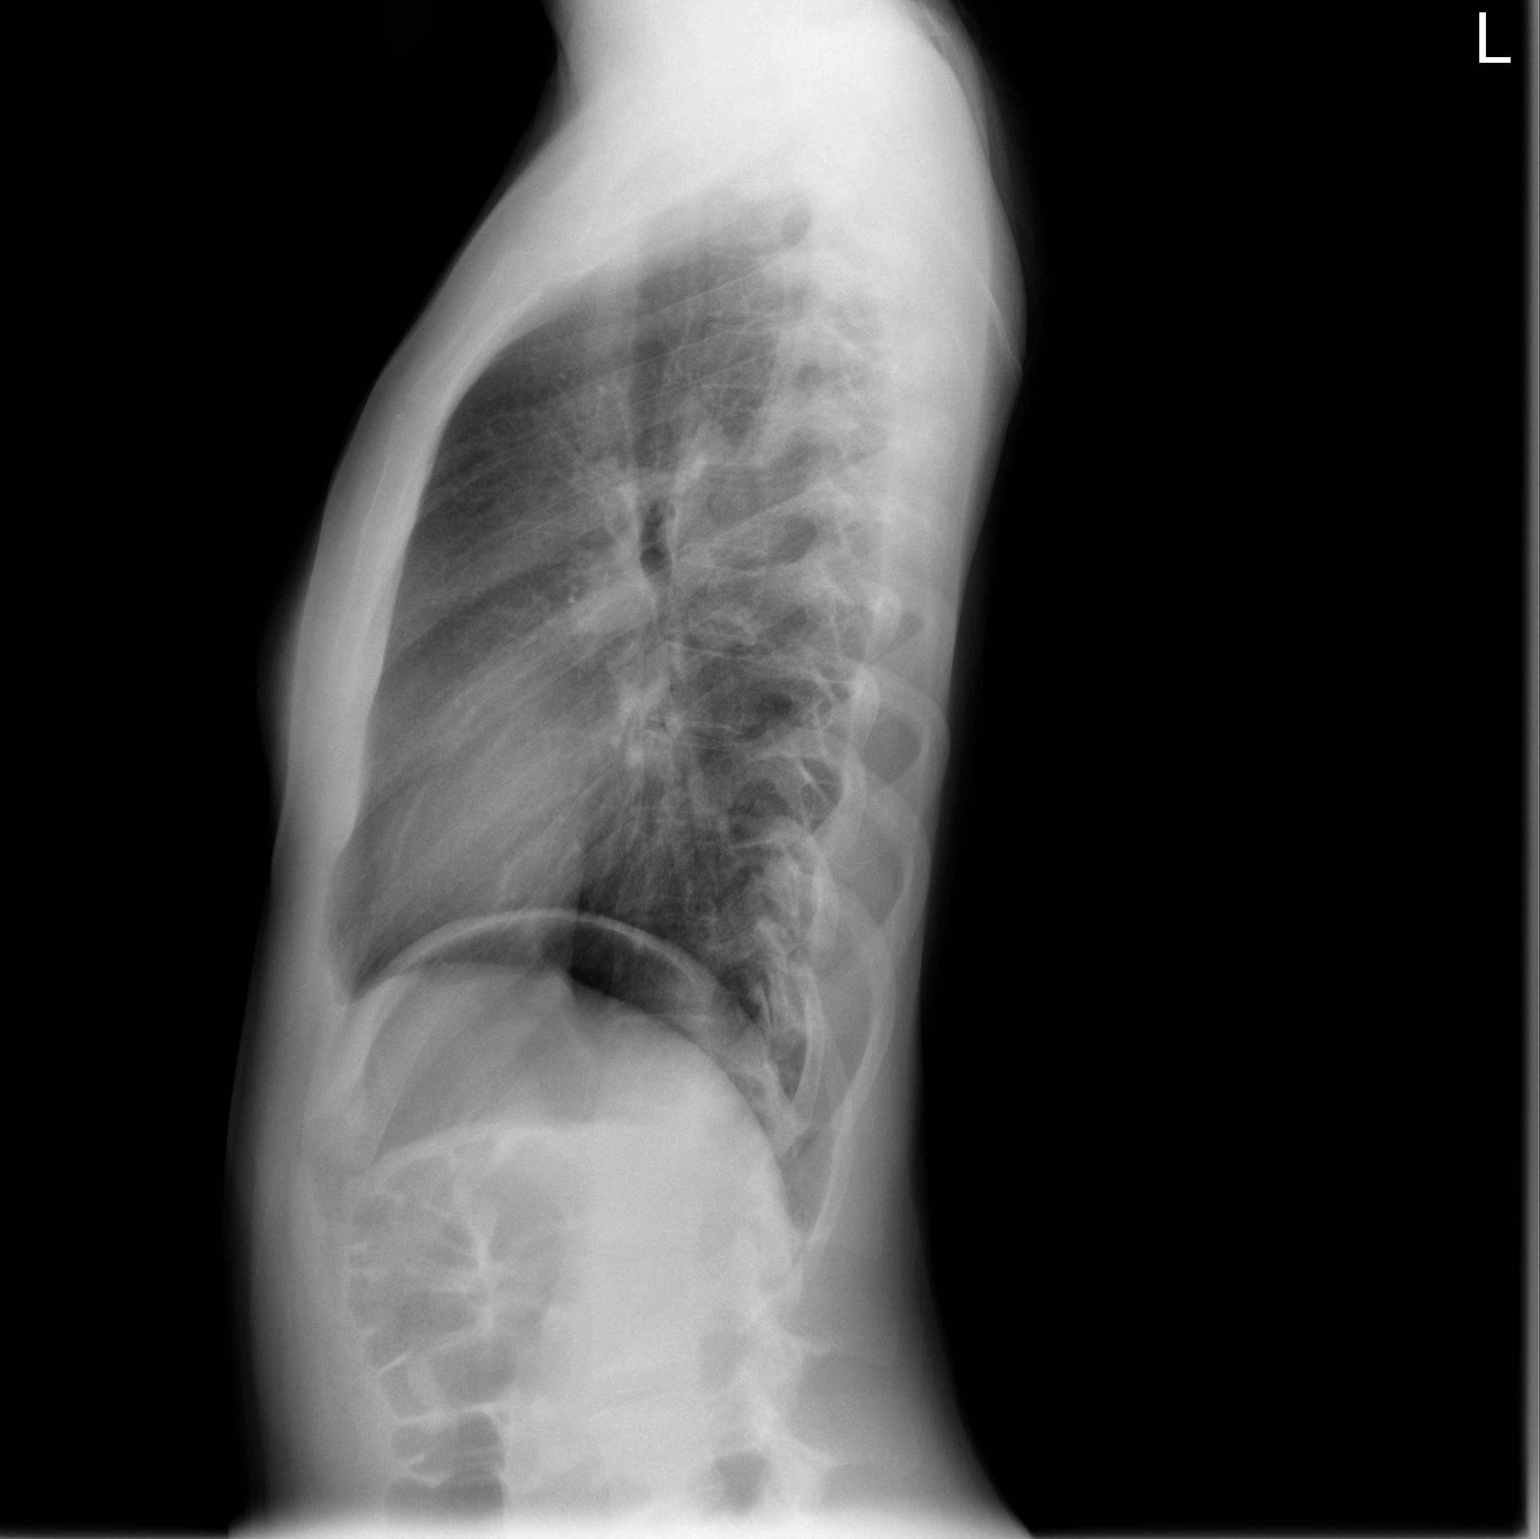

[2 of 2 positions shown; findings below may reference images not displayed]

FINDINGS: The heart size and mediastinal contours are within normal limits.
Both lungs are clear. No pneumothorax or pleural effusion is noted.
The visualized skeletal structures are unremarkable.
IMPRESSION: No acute cardiopulmonary abnormality seen.
